# Patient Record
Sex: Female | Born: 1980 | Race: White | Hispanic: No | Marital: Single | Smoking: Current every day smoker
Health system: Southern US, Community
[De-identification: ages and names within clinical notes are randomized; demographics above are authoritative.]

---

## 2019-09-06 DIAGNOSIS — R0989 Other specified symptoms and signs involving the circulatory and respiratory systems: Secondary | ICD-10-CM | POA: Diagnosis not present

## 2019-09-06 DIAGNOSIS — U071 COVID-19: Secondary | ICD-10-CM | POA: Diagnosis not present

## 2019-09-06 DIAGNOSIS — R05 Cough: Secondary | ICD-10-CM | POA: Diagnosis not present

## 2019-09-06 DIAGNOSIS — R509 Fever, unspecified: Secondary | ICD-10-CM | POA: Diagnosis not present

## 2020-05-03 ENCOUNTER — Emergency Department
Admission: RE | Admit: 2020-05-03 | Discharge: 2020-05-03 | Disposition: A | Discharge status: Home / Self Care | Payer: BC Managed Care – PPO | Source: Ambulatory Visit | Attending: Family Medicine | Admitting: Family Medicine

## 2020-05-03 ENCOUNTER — Other Ambulatory Visit: Payer: Self-pay

## 2020-05-03 VITALS — BP 140/84 | HR 110 | Temp 98.8°F | Resp 18 | Ht 65.0 in | Wt 230.0 lb

## 2020-05-03 DIAGNOSIS — R059 Cough, unspecified: Secondary | ICD-10-CM

## 2020-05-03 DIAGNOSIS — J069 Acute upper respiratory infection, unspecified: Secondary | ICD-10-CM

## 2020-05-03 DIAGNOSIS — R05 Cough: Secondary | ICD-10-CM | POA: Diagnosis not present

## 2020-05-03 DIAGNOSIS — R21 Rash and other nonspecific skin eruption: Secondary | ICD-10-CM

## 2020-05-03 LAB — POC SARS CORONAVIRUS 2 AG -  ED: SARS Coronavirus 2 Ag: NEGATIVE

## 2020-05-03 NOTE — ED Triage Notes (Signed)
Pt c/o rash behind ears on Friday. By Midday Saturday, had rash all over back of neck. Now having runny nose, sneezing, congestion, sore throat, cough and clogged ears. Benedryl and ibuprofen prn.   No known covid exposure. Had covid in Jan. Has not had covid vaccinations.

## 2020-05-03 NOTE — Discharge Instructions (Addendum)
Take plain guaifenesin (1200mg  extended release tabs such as Mucinex) twice daily, with plenty of water, for cough and congestion.  May add Pseudoephedrine (30mg , one or two every 4 to 6 hours) for sinus congestion.  Get adequate rest.   May use Afrin nasal spray (or generic oxymetazoline) each morning for about 5 days and then discontinue.  Also recommend using saline nasal spray several times daily and saline nasal irrigation (AYR is a common brand).  Use Flonase nasal spray each morning after using Afrin nasal spray and saline nasal irrigation. Try warm salt water gargles for sore throat.  Stop all antihistamines for now, and other non-prescription cough/cold preparations. May take Ibuprofen 200mg , 4 tabs every 8 hours with food for body aches, fever, etc. May take Delsym Cough Suppressant ("12 Hour Cough Relief") at bedtime for nighttime cough.   Isolate yourself until COVID-19 test result is available.   If your COVID19 test is positive, then you are infected with the novel coronavirus and could give the virus to others.  Please continue isolation at home for at least 10 days since the start of your symptoms. Once you complete your 10 day quarantine, you may return to normal activities as long as you've not had a fever for over 24 hours (without taking fever reducing medicine) and your symptoms are improving. Please continue good preventive care measures, including:  frequent hand-washing, avoid touching your face, cover coughs/sneezes, stay out of crowds and keep a 6 foot distance from others.  Go to the nearest hospital emergency room if fever/cough/breathlessness are severe or illness seems like a threat to life.

## 2020-05-03 NOTE — ED Provider Notes (Signed)
[facility]    CSN: [id number] Arrival date & time: 05/03/20  1447      History   Chief Complaint Chief Complaint  Patient presents with  . Appointment    3pm  . Rash  . Cough  . Nasal Congestion    HPI Rebekah Gaines is a 39 y.o. female.   Patient developed a rash on her right neck behind her ear five days ago.  The next day she developed sinus congestion, myalgias, headache, and fatigue.  The rash spread somewhat to cover her posterior neck.  Yesterday she developed a cough, and today a fever.  She notes that her taste has been decreased today. She had COVID19 infection in January.  The history is provided by the patient.    History reviewed. No pertinent past medical history.  There are no problems to display for this patient.   History reviewed. No pertinent surgical history.  OB History   No obstetric history on file.      Home Medications    Prior to Admission medications   Not on File    Family History History reviewed. No pertinent family history.  Social History Social History   Tobacco Use  . Smoking status: Current Every Day Smoker    Packs/day: 1.00  . Smokeless tobacco: Never Used  Vaping Use  . Vaping Use: Never used  Substance Use Topics  . Alcohol use: Not Currently  . Drug use: Not on file     Allergies   Codeine and Hydrocodone   Review of Systems Review of Systems + sore throat + cough + sneezing No pleuritic pain No wheezing + nasal congestion + post-nasal drainage No sinus pain/pressure No itchy/red eyes ? earache No hemoptysis No SOB + fever, + chills No nausea No vomiting No abdominal pain No diarrhea No urinary symptoms + skin rash + fatigue + myalgias + headache Used OTC meds (Benadryl) without relief   Physical Exam Triage Vital Signs ED Triage Vitals  Enc Vitals Group     BP 05/03/20 1505 140/84     Pulse Rate 05/03/20 1505 (!) 110     Resp 05/03/20 1505 18     Temp  05/03/20 1505 98.8 F (37.1 C)     Temp Source 05/03/20 1505 Oral     SpO2 05/03/20 1505 98 %     Weight 05/03/20 1507 230 lb (104.3 kg)     Height 05/03/20 1507 5\' 5"  (1.651 m)     Head Circumference --      Peak Flow --      Pain Score 05/03/20 1506 2     Pain Loc --      Pain Edu? --      Excl. in GC? --    No data found.  Updated Vital Signs BP 140/84 (BP Location: Right Arm)   Pulse (!) 110   Temp 98.8 F (37.1 C) (Oral)   Resp 18   Ht 5\' 5"  (1.651 m)   Wt 104.3 kg   LMP 04/26/2020 (Approximate)   SpO2 98%   BMI 38.27 kg/m   Visual Acuity Right Eye Distance:   Left Eye Distance:   Bilateral Distance:    Right Eye Near:   Left Eye Near:    Bilateral Near:     Physical Exam Skin:    Findings: Rash present. Rash is macular. Rash is not crusting, nodular, pustular, urticarial or vesicular.          Comments:  Posterior neck has a mild macular irregular erythematous rash.  No warmth, swelling, or tenderness to palpation.    Nursing notes and Vital Signs reviewed. Appearance:  Patient appears stated age, and in no acute distress Eyes:  Pupils are equal, round, and reactive to light and accomodation.  Extraocular movement is intact.  Conjunctivae are not inflamed  Ears:  Canals normal.  Tympanic membranes normal.  Nose:  Mildly congested turbinates.  No sinus tenderness. Pharynx:  Normal Neck:  Supple.  Mildly enlarged lateral nodes are present, tender to palpation on the left.   Lungs:  Clear to auscultation.  Breath sounds are equal.  Moving air well. Heart:  Regular rate and rhythm without murmurs, rubs, or gallops.  Abdomen:  Nontender without masses or hepatosplenomegaly.  Bowel sounds are present.  No CVA or flank tenderness.  Extremities:  No edema.    UC Treatments / Results  Labs (all labs ordered are listed, but only abnormal results are displayed) Labs Reviewed  NOVEL CORONAVIRUS, NAA  POC SARS CORONAVIRUS 2 AG -  ED negative     EKG   Radiology No results found.  Procedures Procedures (including critical care time)  Medications Ordered in UC Medications - No data to display  Initial Impression / Assessment and Plan / UC Course  I have reviewed the triage vital signs and the nursing notes.  Pertinent labs & imaging results that were available during my care of the patient were reviewed by me and considered in my medical decision making (see chart for details).    There is no evidence of bacterial infection today.  Suspect recurrent COVID19, and viral exanthem. COVID19 PCR pending.   Final Clinical Impressions(s) / UC Diagnoses   Final diagnoses:  Cough  Viral URI with cough  Rash and nonspecific skin eruption     Discharge Instructions     Take plain guaifenesin (1200mg  extended release tabs such as Mucinex) twice daily, with plenty of water, for cough and congestion.  May add Pseudoephedrine (30mg , one or two every 4 to 6 hours) for sinus congestion.  Get adequate rest.   May use Afrin nasal spray (or generic oxymetazoline) each morning for about 5 days and then discontinue.  Also recommend using saline nasal spray several times daily and saline nasal irrigation (AYR is a common brand).  Use Flonase nasal spray each morning after using Afrin nasal spray and saline nasal irrigation. Try warm salt water gargles for sore throat.  Stop all antihistamines for now, and other non-prescription cough/cold preparations. May take Ibuprofen 200mg , 4 tabs every 8 hours with food for body aches, fever, etc. May take Delsym Cough Suppressant ("12 Hour Cough Relief") at bedtime for nighttime cough.   Isolate yourself until COVID-19 test result is available.   If your COVID19 test is positive, then you are infected with the novel coronavirus and could give the virus to others.  Please continue isolation at home for at least 10 days since the start of your symptoms. Once you complete your 10 day quarantine, you  may return to normal activities as long as you've not had a fever for over 24 hours (without taking fever reducing medicine) and your symptoms are improving. Please continue good preventive care measures, including:  frequent hand-washing, avoid touching your face, cover coughs/sneezes, stay out of crowds and keep a 6 foot distance from others.  Go to the nearest hospital emergency room if fever/cough/breathlessness are severe or illness seems like a threat to life.  ED Prescriptions    None        Lattie Haw, MD 05/06/20 1723

## 2020-05-05 LAB — NOVEL CORONAVIRUS, NAA: SARS-CoV-2, NAA: NOT DETECTED

## 2020-05-05 LAB — SARS-COV-2, NAA 2 DAY TAT

## 2020-05-08 ENCOUNTER — Other Ambulatory Visit: Payer: Self-pay

## 2020-05-08 ENCOUNTER — Emergency Department (INDEPENDENT_AMBULATORY_CARE_PROVIDER_SITE_OTHER): Payer: BC Managed Care – PPO

## 2020-05-08 ENCOUNTER — Emergency Department
Admission: RE | Admit: 2020-05-08 | Discharge: 2020-05-08 | Disposition: A | Discharge status: Home / Self Care | Payer: BC Managed Care – PPO | Source: Ambulatory Visit

## 2020-05-08 VITALS — BP 141/89 | HR 86 | Temp 98.7°F | Resp 16

## 2020-05-08 DIAGNOSIS — R5383 Other fatigue: Secondary | ICD-10-CM

## 2020-05-08 DIAGNOSIS — J9801 Acute bronchospasm: Secondary | ICD-10-CM | POA: Diagnosis not present

## 2020-05-08 DIAGNOSIS — R21 Rash and other nonspecific skin eruption: Secondary | ICD-10-CM | POA: Diagnosis not present

## 2020-05-08 DIAGNOSIS — R05 Cough: Secondary | ICD-10-CM | POA: Diagnosis not present

## 2020-05-08 DIAGNOSIS — F17200 Nicotine dependence, unspecified, uncomplicated: Secondary | ICD-10-CM | POA: Diagnosis not present

## 2020-05-08 DIAGNOSIS — F17210 Nicotine dependence, cigarettes, uncomplicated: Secondary | ICD-10-CM | POA: Diagnosis not present

## 2020-05-08 DIAGNOSIS — J069 Acute upper respiratory infection, unspecified: Secondary | ICD-10-CM

## 2020-05-08 MED ORDER — PREDNISONE 50 MG PO TABS: 50.0000 mg | ORAL_TABLET | Freq: Every day | ORAL | 0 refills | Status: AC

## 2020-05-08 MED ORDER — DEXAMETHASONE SODIUM PHOSPHATE 10 MG/ML IJ SOLN
10.0000 mg | Freq: Once | INTRAMUSCULAR | Status: AC
  Administered 2020-05-08: 10 mg via INTRAMUSCULAR

## 2020-05-08 MED ORDER — ALBUTEROL SULFATE HFA 108 (90 BASE) MCG/ACT IN AERS: 1.0000 | INHALATION_SPRAY | Freq: Four times a day (QID) | RESPIRATORY_TRACT | 0 refills | Status: DC | PRN

## 2020-05-08 MED ORDER — PROMETHAZINE-DM 6.25-15 MG/5ML PO SYRP: 5.0000 mL | ORAL_SOLUTION | Freq: Three times a day (TID) | ORAL | 0 refills | Status: DC | PRN

## 2020-05-08 NOTE — ED Triage Notes (Signed)
Patient presents to Urgent Care with complaints of dry cough since last week. Patient reports she was here last week for same, but the other symptoms have gone away and the cough is relentless.  Pt was tested for covid during her visit last week, rapid and PCR were both negative.

## 2020-05-08 NOTE — Discharge Instructions (Signed)
°  Please take the medications as prescribed. You were given your first dose of steroids tonight. You may start the oral prednisone pills tomorrow or Wednesday morning with breakfast to help with cough.  Promethazine-dextromethorphan (promethazine-DM) is a strong cough medication to help you limit your coughing at night to help you rest. It can cause drowsiness and dizziness. It can cause trouble breathing and accidental overdose if taking more than prescribed or if taken with other medications that can cause drowsiness. Do not take with Benadryl, Nyquil, Tylenol PM or other medications that can cause drowsiness. Do not drive or operate heavy machinery while taking.    Call to schedule a follow up appointment with your primary care provider or establish care with a new primary care provider if needed. You may also try to schedule an appointment with the post-COVID care center for further evaluation and treatment of symptoms that may be related to your initial COVID infection in January.   Call 911 or have someone drive you to the hospital if symptoms significantly worsening.

## 2020-05-08 NOTE — ED Provider Notes (Signed)
[facility]    CSN: [id number] Arrival date & time: 05/08/20  1753      History   Chief Complaint Chief Complaint  Patient presents with  . Appointment    6:00  . Cough    HPI Rebekah Gaines is a 39 y.o. female.   HPI  Rebekah Gaines is a 39 y.o. female presenting to UC with c/o continued dry hacking cough that keeps her up at night. Pt was seen at [facility] on 05/03/20, dx with a viral URI. She had a negative rapid COVID and negative PCR COVID test. Her congestion, headaches, myalgias, fatigue and low grade fever have resolved but she has a hoarse voice and continued cough.  She did have COVID in January 2021.  States her chest heaviness never fully resolved.  No known sick contacts recently.  Denies n/v/d. She has tried various cough medications without relief.  No hx of asthma but needed an inhaler after getting sick about 10 years ago.   History reviewed. No pertinent past medical history.  There are no problems to display for this patient.   History reviewed. No pertinent surgical history.  OB History   No obstetric history on file.      Home Medications    Prior to Admission medications   Medication Sig Start Date End Date Taking? Authorizing Provider  albuterol (VENTOLIN HFA) 108 (90 Base) MCG/ACT inhaler Inhale 1-2 puffs into the lungs every 6 (six) hours as needed for wheezing or shortness of breath. 05/08/20   Lurene Shadow, PA-C  predniSONE (DELTASONE) 50 MG tablet Take 1 tablet (50 mg total) by mouth daily with breakfast for 5 days. 05/08/20 05/13/20  Lurene Shadow, PA-C  promethazine-dextromethorphan (PROMETHAZINE-DM) 6.25-15 MG/5ML syrup Take 5 mLs by mouth 3 (three) times daily as needed for cough. 05/08/20   Lurene Shadow, PA-C    Family History Family History  Problem Relation Age of Onset  . Healthy Mother   . Healthy Father     Social History Social History   Tobacco Use  . Smoking status: Current Every Day Smoker     Packs/day: 1.00  . Smokeless tobacco: Never Used  Vaping Use  . Vaping Use: Never used  Substance Use Topics  . Alcohol use: Not Currently  . Drug use: Not on file     Allergies   Codeine and Hydrocodone   Review of Systems Review of Systems  Constitutional: Negative for chills and fever.  HENT: Positive for voice change. Negative for congestion, ear pain, sore throat and trouble swallowing.   Respiratory: Positive for cough and chest tightness. Negative for shortness of breath.   Cardiovascular: Negative for chest pain and palpitations.  Gastrointestinal: Negative for abdominal pain, diarrhea, nausea and vomiting.  Musculoskeletal: Negative for arthralgias, back pain and myalgias.  Skin: Negative for rash.  Neurological: Negative for dizziness, light-headedness and headaches.  All other systems reviewed and are negative.    Physical Exam Triage Vital Signs ED Triage Vitals  Enc Vitals Group     BP 05/08/20 1850 (!) 141/89     Pulse Rate 05/08/20 1850 86     Resp 05/08/20 1850 16     Temp 05/08/20 1850 98.7 F (37.1 C)     Temp Source 05/08/20 1850 Oral     SpO2 05/08/20 1850 99 %     Weight --      Height --      Head Circumference --  Peak Flow --      Pain Score 05/08/20 1848 1     Pain Loc --      Pain Edu? --      Excl. in GC? --    No data found.  Updated Vital Signs BP (!) 141/89 (BP Location: Right Arm)   Pulse 86   Temp 98.7 F (37.1 C) (Oral)   Resp 16   LMP 04/26/2020 (Approximate)   SpO2 99%   Visual Acuity Right Eye Distance:   Left Eye Distance:   Bilateral Distance:    Right Eye Near:   Left Eye Near:    Bilateral Near:     Physical Exam Vitals and nursing note reviewed.  Constitutional:      General: She is not in acute distress.    Appearance: Normal appearance. She is well-developed. She is not ill-appearing, toxic-appearing or diaphoretic.  HENT:     Head: Normocephalic and atraumatic.     Right Ear: Tympanic membrane  and ear canal normal.     Left Ear: Tympanic membrane and ear canal normal.     Nose: Nose normal.     Mouth/Throat:     Mouth: Mucous membranes are moist.     Pharynx: Oropharynx is clear.  Cardiovascular:     Rate and Rhythm: Normal rate and regular rhythm.  Pulmonary:     Effort: Pulmonary effort is normal. No respiratory distress.     Breath sounds: No stridor. Wheezing present. No rhonchi or rales.     Comments: Dry hacking cough during exam but no respiratory distress. Able to speak in full sentences. No accessory muscle use.  Hoarse voice but no stridor. Diffuse wheeze noted.  Musculoskeletal:        General: Normal range of motion.     Cervical back: Normal range of motion and neck supple. No tenderness.  Lymphadenopathy:     Cervical: No cervical adenopathy.  Skin:    General: Skin is warm and dry.  Neurological:     Mental Status: She is alert and oriented to person, place, and time.  Psychiatric:        Behavior: Behavior normal.      UC Treatments / Results  Labs (all labs ordered are listed, but only abnormal results are displayed) Labs Reviewed - No data to display  EKG   Radiology DG Chest 2 View  Result Date: 05/08/2020 CLINICAL DATA:  39 year old female with cough, fatigue, rash. Negative for COVID-19 last week. Smoker. EXAM: CHEST - 2 VIEW COMPARISON:  None. FINDINGS: Lung volumes and mediastinal contours are within normal limits. Visualized tracheal air column is within normal limits. No pneumothorax, pleural effusion or confluent pulmonary opacity. But there is mild diffuse bilateral increased pulmonary interstitial opacity, symmetric. No acute osseous abnormality identified. Negative visible bowel gas pattern. Cholecystectomy clips. IMPRESSION: Mild diffuse increased pulmonary interstitial opacity is favored to be the sequelae of smoking, but viral/atypical respiratory infection is difficult to exclude. Electronically Signed   By: Odessa Fleming M.D.   On:  05/08/2020 19:18    Procedures Procedures (including critical care time)  Medications Ordered in UC Medications  dexamethasone (DECADRON) injection 10 mg (has no administration in time range)    Initial Impression / Assessment and Plan / UC Course  I have reviewed the triage vital signs and the nursing notes.  Pertinent labs & imaging results that were available during my care of the patient were reviewed by me and considered in my medical decision making (  see chart for details).     Discussed imaging with pt Encouraged to stop smoking cigarettes Due to prior COVID infection, encouraged to call post-COVID care clinic for appointment as they may be able to help with ongoing care.  Discussed symptoms that warrant emergent care in the ED. AVS given  Final Clinical Impressions(s) / UC Diagnoses   Final diagnoses:  Acute bronchospasm  Viral URI with cough  Cigarette smoker     Discharge Instructions      Please take the medications as prescribed. You were given your first dose of steroids tonight. You may start the oral prednisone pills tomorrow or Wednesday morning with breakfast to help with cough.  Promethazine-dextromethorphan (promethazine-DM) is a strong cough medication to help you limit your coughing at night to help you rest. It can cause drowsiness and dizziness. It can cause trouble breathing and accidental overdose if taking more than prescribed or if taken with other medications that can cause drowsiness. Do not take with Benadryl, Nyquil, Tylenol PM or other medications that can cause drowsiness. Do not drive or operate heavy machinery while taking.    Call to schedule a follow up appointment with your primary care provider or establish care with a new primary care provider if needed. You may also try to schedule an appointment with the post-COVID care center for further evaluation and treatment of symptoms that may be related to your initial COVID infection in  January.   Call 911 or have someone drive you to the hospital if symptoms significantly worsening.     ED Prescriptions    Medication Sig Dispense Auth. Provider   albuterol (VENTOLIN HFA) 108 (90 Base) MCG/ACT inhaler Inhale 1-2 puffs into the lungs every 6 (six) hours as needed for wheezing or shortness of breath. 18 g Duell Holdren O, PA-C   promethazine-dextromethorphan (PROMETHAZINE-DM) 6.25-15 MG/5ML syrup Take 5 mLs by mouth 3 (three) times daily as needed for cough. 118 mL Doroteo Glassman, Kamdyn Colborn O, PA-C   predniSONE (DELTASONE) 50 MG tablet Take 1 tablet (50 mg total) by mouth daily with breakfast for 5 days. 5 tablet Lurene Shadow, PA-C     PDMP not reviewed this encounter.   Lurene Shadow, [place] 05/08/20 1928

## 2021-04-15 ENCOUNTER — Encounter: Payer: Self-pay | Admitting: Emergency Medicine

## 2021-04-15 ENCOUNTER — Emergency Department
Admission: EM | Admit: 2021-04-15 | Discharge: 2021-04-15 | Disposition: A | Discharge status: Home / Self Care | Payer: BC Managed Care – PPO | Source: Home / Self Care | Attending: Family Medicine | Admitting: Family Medicine

## 2021-04-15 ENCOUNTER — Other Ambulatory Visit: Payer: Self-pay

## 2021-04-15 DIAGNOSIS — M7662 Achilles tendinitis, left leg: Secondary | ICD-10-CM

## 2021-04-15 MED ORDER — IBUPROFEN 600 MG PO TABS
600.0000 mg | ORAL_TABLET | Freq: Once | ORAL | Status: AC
  Administered 2021-04-15: 600 mg via ORAL

## 2021-04-15 MED ORDER — TRAMADOL HCL 50 MG PO TABS: 50.0000 mg | ORAL_TABLET | Freq: Four times a day (QID) | ORAL | 0 refills | Status: DC | PRN

## 2021-04-15 MED ORDER — KETOROLAC TROMETHAMINE 60 MG/2ML IM SOLN: 60.0000 mg | Freq: Once | INTRAMUSCULAR | Status: DC

## 2021-04-15 MED ORDER — METHYLPREDNISOLONE 4 MG PO TBPK: ORAL_TABLET | ORAL | 0 refills | Status: DC

## 2021-04-15 NOTE — ED Triage Notes (Signed)
Patient states that she went to be on Friday night w/o any problem, awoke yesterday in extreme pain in her left foot.  No injury.  Unable to bare weight.  Pain radiates up the back of the foot.  Patient has taken Ibuprofen, elevation and ICE.

## 2021-04-15 NOTE — ED Provider Notes (Signed)
[facility]    CSN: [id number] Arrival date & time: 04/15/21  1432      History   Chief Complaint Chief Complaint  Patient presents with   Foot Pain    HPI Rebekah Gaines is a 40 y.o. female.   HPI  She states she was working a normal day on Friday.  Completed 9-hour shift.  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  She did not have any unusual activity.  No fall.  No new shoewear.  Woke up Saturday morning with severe ankle pain.  Now can hardly bear weight because of the pain.  Is never had problems like this before.  History reviewed. No pertinent past medical history.  There are no problems to display for this patient.   History reviewed. No pertinent surgical history.  OB History   No obstetric history on file.      Home Medications    Prior to Admission medications   Medication Sig Start Date End Date Taking? Authorizing Provider  albuterol (VENTOLIN HFA) 108 (90 Base) MCG/ACT inhaler Inhale 1-2 puffs into the lungs every 6 (six) hours as needed for wheezing or shortness of breath. 05/08/20  Yes Lurene Shadow, PA-C  methylPREDNISolone (MEDROL DOSEPAK) 4 MG TBPK tablet tad 04/15/21  Yes Eustace Moore, MD  traMADol (ULTRAM) 50 MG tablet Take 1 tablet (50 mg total) by mouth every 6 (six) hours as needed. 04/15/21  Yes Eustace Moore, MD  promethazine-dextromethorphan (PROMETHAZINE-DM) 6.25-15 MG/5ML syrup Take 5 mLs by mouth 3 (three) times daily as needed for cough. 05/08/20   Lurene Shadow, PA-C    Family History Family History  Problem Relation Age of Onset   Healthy Mother    Healthy Father     Social History Social History   Tobacco Use   Smoking status: Every Day    Packs/day: 1.00    Types: Cigarettes   Smokeless tobacco: Never  Vaping Use   Vaping Use: Never used  Substance Use Topics   Alcohol use: Not Currently     Allergies   Codeine and Hydrocodone   Review of Systems Review of  Systems See HPI  Physical Exam Triage Vital Signs ED Triage Vitals  Enc Vitals Group     BP 04/15/21 1457 133/85     Pulse Rate 04/15/21 1457 81     Resp 04/15/21 1457 18     Temp 04/15/21 1457 98.5 F (36.9 C)     Temp Source 04/15/21 1457 Oral     SpO2 04/15/21 1457 98 %     Weight 04/15/21 1459 230 lb (104.3 kg)     Height 04/15/21 1459 5' 6.25" (1.683 m)     Head Circumference --      Peak Flow --      Pain Score 04/15/21 1458 5     Pain Loc --      Pain Edu? --      Excl. in GC? --    No data found.  Updated Vital Signs BP 133/85 (BP Location: Right Arm)   Pulse 81   Temp 98.5 F (36.9 C) (Oral)   Resp 18   Ht 5' 6.25" (1.683 m)   Wt 104.3 kg   LMP 04/09/2021   SpO2 98%   BMI 36.84 kg/m      Physical Exam Vitals and nursing note reviewed.  Constitutional:      General: She is not in acute  distress.    Appearance: She is well-developed. She is obese.  HENT:     Head: Normocephalic and atraumatic.     Mouth/Throat:     Comments: Mask is in place Eyes:     Conjunctiva/sclera: Conjunctivae normal.     Pupils: Pupils are equal, round, and reactive to light.  Cardiovascular:     Rate and Rhythm: Normal rate.  Pulmonary:     Effort: Pulmonary effort is normal. No respiratory distress.  Abdominal:     General: There is no distension.     Palpations: Abdomen is soft.  Musculoskeletal:        General: Normal range of motion.     Cervical back: Normal range of motion.     Comments: Patient holds left foot in slightly flexed position.  Has tenderness of her Achilles tendons above Achilles bursa region.  Warmth in the area.  No swelling noted.  The Achilles tendon is palpated lightly and no defect is felt.  Skin:    General: Skin is warm and dry.  Neurological:     Mental Status: She is alert.     Gait: Gait abnormal.  Psychiatric:        Mood and Affect: Mood normal.        Behavior: Behavior normal.     UC Treatments / Results  Labs (all labs  ordered are listed, but only abnormal results are displayed) Labs Reviewed - No data to display  EKG   Radiology No results found.  Procedures Procedures (including critical care time)  Medications Ordered in UC Medications  ibuprofen (ADVIL) tablet 600 mg (600 mg Oral Given 04/15/21 1555)    Initial Impression / Assessment and Plan / UC Course  I have reviewed the triage vital signs and the nursing notes.  Pertinent labs & imaging results that were available during my care of the patient were reviewed by me and considered in my medical decision making (see chart for details).     Discussed ice.  Rest.  Anti-inflammatories.  Medicine follow-up. Final Clinical Impressions(s) / UC Diagnoses   Final diagnoses:  Left Achilles tendinitis     Discharge Instructions      Stay off of foot.  Wear boot Use ice on painful heel area for 20 minutes every couple of hours Take the prednisone pack as directed.  Take all of day 1 today Take tramadol if needed for severe pain.  Take tramadol with food Follow-up with sports medicine.  Call in the morning for an appointment next week   ED Prescriptions     Medication Sig Dispense Auth. Provider   methylPREDNISolone (MEDROL DOSEPAK) 4 MG TBPK tablet tad 21 tablet Eustace Moore, MD   traMADol (ULTRAM) 50 MG tablet Take 1 tablet (50 mg total) by mouth every 6 (six) hours as needed. 15 tablet Eustace Moore, MD      I have reviewed the PDMP during this encounter.   Eustace Moore, MD 04/15/21 [phone]

## 2021-04-15 NOTE — Discharge Instructions (Signed)
Stay off of foot.  Wear boot Use ice on painful heel area for 20 minutes every couple of hours Take the prednisone pack as directed.  Take all of day 1 today Take tramadol if needed for severe pain.  Take tramadol with food Follow-up with sports medicine.  Call in the morning for an appointment next week

## 2021-04-25 ENCOUNTER — Ambulatory Visit (INDEPENDENT_AMBULATORY_CARE_PROVIDER_SITE_OTHER): Payer: BC Managed Care – PPO | Admitting: Sports Medicine

## 2021-04-25 ENCOUNTER — Other Ambulatory Visit: Payer: Self-pay

## 2021-04-25 DIAGNOSIS — M5416 Radiculopathy, lumbar region: Secondary | ICD-10-CM | POA: Insufficient documentation

## 2021-04-25 DIAGNOSIS — S86112A Strain of other muscle(s) and tendon(s) of posterior muscle group at lower leg level, left leg, initial encounter: Secondary | ICD-10-CM | POA: Diagnosis not present

## 2021-04-25 MED ORDER — IBUPROFEN 200 MG PO TABS: 800.0000 mg | ORAL_TABLET | Freq: Three times a day (TID) | ORAL | 0 refills | Status: AC | PRN

## 2021-04-25 NOTE — Progress Notes (Signed)
    Procedures performed today:    None.  Independent interpretation of notes and tests performed by another provider:   None.  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 Approximately week and a half ago she got a bed, took a step and felt severe pain in her left calf, she localizes this at the musculotendinous junction of her gastrocnemius and the Achilles. She was seen in urgent care, placed in a boot and referred to me, on exam she has a bit of bruising over the proximal Achilles, tenderness at the musculotendinous junction, she has a negative Thompson's test. Continue boot, I will add heel lifts to her boot and her shoe, she will do 800 mg of ibuprofen 3 times daily, she prefers over-the-counter. Continue boot for only another week. Calf strain conditioning given. Return to see me in approximately 2 weeks.    ___________________________________________ Ihor Austin. Benjamin Stain, M.D., ABFM., CAQSM. Primary Care and Sports Medicine [facility]  Adjunct Instructor of Family Medicine  [employer]

## 2021-04-25 NOTE — Assessment & Plan Note (Signed)
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 Approximately week and a half ago she got a bed, took a step and felt severe pain in her left calf, she localizes this at the musculotendinous junction of her gastrocnemius and the Achilles. She was seen in urgent care, placed in a boot and referred to me, on exam she has a bit of bruising over the proximal Achilles, tenderness at the musculotendinous junction, she has a negative Thompson's test. Continue boot, I will add heel lifts to her boot and her shoe, she will do 800 mg of ibuprofen 3 times daily, she prefers over-the-counter. Continue boot for only another week. Calf strain conditioning given. Return to see me in approximately 2 weeks.

## 2021-05-02 ENCOUNTER — Other Ambulatory Visit: Payer: Self-pay

## 2021-05-02 ENCOUNTER — Emergency Department
Admission: EM | Admit: 2021-05-02 | Discharge: 2021-05-02 | Disposition: A | Discharge status: Home / Self Care | Payer: BC Managed Care – PPO | Source: Home / Self Care | Attending: Family Medicine | Admitting: Family Medicine

## 2021-05-02 ENCOUNTER — Encounter: Payer: Self-pay | Admitting: Emergency Medicine

## 2021-05-02 DIAGNOSIS — R200 Anesthesia of skin: Secondary | ICD-10-CM | POA: Diagnosis not present

## 2021-05-02 MED ORDER — GABAPENTIN 300 MG PO CAPS: ORAL_CAPSULE | ORAL | 0 refills | Status: AC

## 2021-05-02 NOTE — Discharge Instructions (Addendum)
Gabapentin is for nerve pain.  Start with one at bedtime.  Then increase to two a day, then three a day as you build up tolerance to drowsiness.   Continue advil 600 mg 3x a day with food Activity as tolerated See Dr Benjamin Stain at his next available opening

## 2021-05-02 NOTE — ED Triage Notes (Signed)
Rt Leg pain, tingling, numbness x 5 days. Seeing Dr T for treatment called him today he advised her to wait until her appointment on the 14th, to see him, but she did not want to wait. Was in aircast until Monday, had a heel insert for 2 days took heel insert out because tingling started.

## 2021-05-02 NOTE — ED Provider Notes (Signed)
[facility]    CSN: [id number] Arrival date & time: 05/02/21  1428      History   Chief Complaint No chief complaint on file.   HPI Rebekah Gaines is a 40 y.o. female.   HPI  Patient is here for follow-up of her leg injury.  She states that she followed my instructions and followed up with Dr. Benjamin Stain.  He placed her inhalers.  He gave her exercises.  He recommended continued use of Advil.  Activity as tolerated.  She states that he put heel lifts in her shoe.  I explained to her that this is appropriate treatment for Achilles tendinitis.  She states that they bothered her so she removes them Friday after work, 5 days ago.  Since that time she has developed pain and numbness in her right leg.  This is the opposite leg from her original Achilles tendinitis.  She states that if she puts her foot on the floor she cannot tell whether the floor is hot or cold.  The entire foot is numb.  The numbness extends up the right leg and she points to her anterior superior iliac spine as the origin.  No back pain.  No back injury.  She feels like this is happened because of gait alteration due to heel lifts due to Achilles tendinitis.  She has never had back problems or sciatica.  She is never experienced numbness in extremity.  She is not diabetic.  Has never been diagnosed with vitamin deficiencies or other cause of a neuropathy.  Does not have any neuropathies that run in her family.  History reviewed. No pertinent past medical history.  Patient Active Problem List   Diagnosis Date Noted   Gastrocnemius strain, left 04/25/2021    History reviewed. No pertinent surgical history.  OB History   No obstetric history on file.      Home Medications    Prior to Admission medications   Medication Sig Start Date End Date Taking? Authorizing Provider  gabapentin (NEURONTIN) 300 MG capsule Start with one capsule po at night.  Increase to 2x then 3x a day as tolerated.  May  cause drowsiness 05/02/21  Yes Eustace Moore, MD  ibuprofen (ADVIL) 200 MG tablet Take 4 tablets (800 mg total) by mouth every 8 (eight) hours as needed. 04/25/21   Monica Becton, MD    Family History Family History  Problem Relation Age of Onset   Healthy Mother    Healthy Father     Social History Social History   Tobacco Use   Smoking status: Every Day    Packs/day: 1.00    Types: Cigarettes   Smokeless tobacco: Never  Vaping Use   Vaping Use: Never used  Substance Use Topics   Alcohol use: Yes   Drug use: Not Currently     Allergies   Codeine and Hydrocodone   Review of Systems Review of Systems See H PI  Physical Exam Triage Vital Signs ED Triage Vitals  Enc Vitals Group     BP 05/02/21 1443 132/86     Pulse Rate 05/02/21 1443 93     Resp 05/02/21 1443 18     Temp 05/02/21 1443 99 F (37.2 C)     Temp Source 05/02/21 1443 Oral     SpO2 05/02/21 1443 97 %     Weight 05/02/21 1444 240 lb (108.9 kg)     Height 05/02/21 1444 5\' 5"  (1.651 m)  Head Circumference --      Peak Flow --      Pain Score 05/02/21 1443 5     Pain Loc --      Pain Edu? --      Excl. in GC? --    No data found.  Updated Vital Signs BP 132/86 (BP Location: Left Arm)   Pulse 93   Temp 99 F (37.2 C) (Oral)   Resp 18   Ht 5\' 5"  (1.651 m)   Wt 108.9 kg   LMP 04/09/2021   SpO2 97%   BMI 39.94 kg/m      Physical Exam Constitutional:      General: She is not in acute distress.    Appearance: She is well-developed. She is obese.  HENT:     Head: Normocephalic and atraumatic.     Mouth/Throat:     Comments: Mask in place Eyes:     Conjunctiva/sclera: Conjunctivae normal.     Pupils: Pupils are equal, round, and reactive to light.  Cardiovascular:     Rate and Rhythm: Normal rate.  Pulmonary:     Effort: Pulmonary effort is normal. No respiratory distress.  Abdominal:     General: There is no distension.     Palpations: Abdomen is soft.   Musculoskeletal:        General: No swelling, tenderness, deformity or signs of injury. Normal range of motion.     Cervical back: Normal range of motion.  Skin:    General: Skin is warm and dry.  Neurological:     Mental Status: She is alert.     Sensory: Sensory deficit present.     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  Every time I touch even lightly she vocalizes and pulls away.  It does not follow a radicular pattern particularly, its includes the entire bottom of her foot, the posterior and both lateral portions of her lower leg with less discomfort in her anterior lower leg.  It is almost a stocking distribution up the leg.  She expresses pain with palpation of the anterior superior iliac spine and greater trochanter.  There is no particular tenderness with palpation over the lumbar spine or SI joint.  She states that straight leg raise on the right increases the tingling and numbness.  Motor examination of lower extremity normal.  Reflexes intact     UC Treatments / Results  Labs (all labs ordered are listed, but only abnormal results are displayed) Labs Reviewed - No data to display  EKG   Radiology No results found.  Procedures Procedures (including critical care time)  Medications Ordered in UC Medications - No data to display  Initial Impression / Assessment and Plan / UC Course  I have reviewed the triage vital signs and the nursing notes.  Pertinent labs & imaging results that were available during my care of the patient were reviewed by me and considered in my medical decision making (see chart for details).     Unusual distribution of numbness described with the patient.  Unusual response to sensory examination.  We will treat her with gabapentin, continue Advil, decreased activity until treatment can be seen again by Dr. 04/11/2021.  Concern for emotional conversion versus beginning neurologic disease such as  peripheral neuropathy or early ALS.  Nerve conduction studies may be indicated if the examination remains abnormal Final Clinical Impressions(s) / UC Diagnoses   Final diagnoses:  Right leg numbness  Discharge Instructions      Gabapentin is for nerve pain.  Start with one at bedtime.  Then increase to two a day, then three a day as you build up tolerance to drowsiness.   Continue advil 600 mg 3x a day with food Activity as tolerated See Dr Benjamin Stain at his next available opening     ED Prescriptions     Medication Sig Dispense Auth. Provider   gabapentin (NEURONTIN) 300 MG capsule Start with one capsule po at night.  Increase to 2x then 3x a day as tolerated.  May cause drowsiness 50 capsule Eustace Moore, MD      PDMP not reviewed this encounter.   Eustace Moore, MD 05/02/21 [phone]

## 2021-05-07 ENCOUNTER — Ambulatory Visit (INDEPENDENT_AMBULATORY_CARE_PROVIDER_SITE_OTHER): Payer: BC Managed Care – PPO | Admitting: Sports Medicine

## 2021-05-07 ENCOUNTER — Other Ambulatory Visit: Payer: Self-pay

## 2021-05-07 ENCOUNTER — Ambulatory Visit (INDEPENDENT_AMBULATORY_CARE_PROVIDER_SITE_OTHER): Payer: BC Managed Care – PPO

## 2021-05-07 DIAGNOSIS — M5416 Radiculopathy, lumbar region: Secondary | ICD-10-CM | POA: Diagnosis not present

## 2021-05-07 DIAGNOSIS — R200 Anesthesia of skin: Secondary | ICD-10-CM | POA: Diagnosis not present

## 2021-05-07 DIAGNOSIS — M21371 Foot drop, right foot: Secondary | ICD-10-CM

## 2021-05-07 DIAGNOSIS — M2578 Osteophyte, vertebrae: Secondary | ICD-10-CM | POA: Diagnosis not present

## 2021-05-07 DIAGNOSIS — R202 Paresthesia of skin: Secondary | ICD-10-CM

## 2021-05-07 DIAGNOSIS — M47816 Spondylosis without myelopathy or radiculopathy, lumbar region: Secondary | ICD-10-CM | POA: Diagnosis not present

## 2021-05-07 NOTE — Progress Notes (Addendum)
    Procedures performed today:    None.  Independent interpretation of notes and tests performed by another provider:   None.  Brief History, Exam, Impression, and Recommendations:    Lumbar radiculopathy, right Rebekah Gaines returns, she is pleasant 40 year old female, we treated her initially for what we suspected to be a gastrocnemius strain, her symptoms have now declare themselves is more radicular, numbness and tingling down the back of the right leg to the bottom of the right foot with loss of temperature sensation. She is also got some foot drop on the left. She had steroids in the recent past that improved her symptoms dramatically but unfortunately recurred. Proceed with x-rays, lumbar spine MRI early due to progressive weakness. Continue gabapentin prescribed in urgent care, out of work until MRI. I do suspect she will get an epidural. Return to see me to go over MRI results.  Update: DDD noted on MRI without overt right-sided foraminal stenosis, right L5-S1 interlaminar epidural ordered.    ___________________________________________ Rebekah Gaines. Rebekah Gaines, M.D., ABFM., CAQSM. Primary Care and Sports Medicine [facility]  Adjunct Instructor of Family Medicine  [employer]

## 2021-05-07 NOTE — Assessment & Plan Note (Addendum)
Rebekah Gaines returns, she is pleasant 40 year old female, we treated her initially for what we suspected to be a gastrocnemius strain, her symptoms have now declare themselves is more radicular, numbness and tingling down the back of the right leg to the bottom of the right foot with loss of temperature sensation. She is also got some foot drop on the left. She had steroids in the recent past that improved her symptoms dramatically but unfortunately recurred. Proceed with x-rays, lumbar spine MRI early due to progressive weakness. Continue gabapentin prescribed in urgent care, out of work until MRI. I do suspect she will get an epidural. Return to see me to go over MRI results.  Update: DDD noted on MRI without overt right-sided foraminal stenosis, right L5-S1 interlaminar epidural ordered.

## 2021-05-09 ENCOUNTER — Ambulatory Visit: Payer: BC Managed Care – PPO | Admitting: Sports Medicine

## 2021-05-13 ENCOUNTER — Ambulatory Visit (INDEPENDENT_AMBULATORY_CARE_PROVIDER_SITE_OTHER): Payer: BC Managed Care – PPO

## 2021-05-13 ENCOUNTER — Other Ambulatory Visit: Payer: Self-pay

## 2021-05-13 DIAGNOSIS — M5416 Radiculopathy, lumbar region: Secondary | ICD-10-CM

## 2021-05-14 NOTE — Addendum Note (Signed)
Addended by: Monica Becton on: 05/14/2021 05:02 PM   Modules accepted: Orders

## 2021-05-25 ENCOUNTER — Other Ambulatory Visit: Payer: Self-pay

## 2021-05-25 ENCOUNTER — Ambulatory Visit
Admission: RE | Admit: 2021-05-25 | Discharge: 2021-05-25 | Disposition: A | Discharge status: Home / Self Care | Payer: BC Managed Care – PPO | Source: Ambulatory Visit | Attending: Sports Medicine | Admitting: Sports Medicine

## 2021-05-25 DIAGNOSIS — M5116 Intervertebral disc disorders with radiculopathy, lumbar region: Secondary | ICD-10-CM | POA: Diagnosis not present

## 2021-05-25 DIAGNOSIS — M5416 Radiculopathy, lumbar region: Secondary | ICD-10-CM

## 2021-05-25 MED ORDER — IOPAMIDOL (ISOVUE-M 200) INJECTION 41%
1.0000 mL | Freq: Once | INTRAMUSCULAR | Status: AC
  Administered 2021-05-25: 1 mL via EPIDURAL

## 2021-05-25 MED ORDER — METHYLPREDNISOLONE ACETATE 40 MG/ML INJ SUSP (RADIOLOG
80.0000 mg | Freq: Once | INTRAMUSCULAR | Status: AC
  Administered 2021-05-25: 80 mg via EPIDURAL

## 2021-05-25 NOTE — Discharge Instructions (Signed)

## 2022-01-08 IMAGING — MR MR LUMBAR SPINE W/O CM
4 of 5 series · 26 of 48 positions shown · non-contrast
Comparison: No prior MRI, correlation is made with radiographs
05/07/2021

CLINICAL DATA: Low back pain, right-sided paresthesias, left-sided
foot drop

EXAM:
MRI LUMBAR SPINE WITHOUT CONTRAST
TECHNIQUE: Multiplanar, multisequence MR imaging of the lumbar spine was
performed. No intravenous contrast was administered.

[Series 2: T2 · sagittal · 4.0mm · 0.81mm/px · 6 of 15 slices shown (1 of 2)]
[im 1/15]
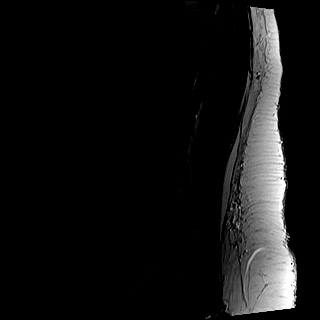
[im 3/15]
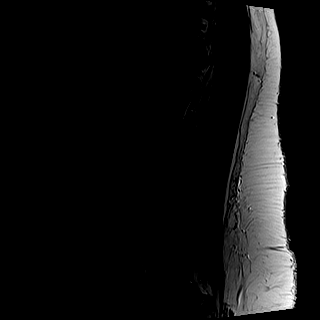
[im 6/15]
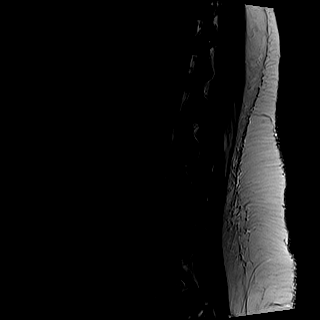
[im 9/15]
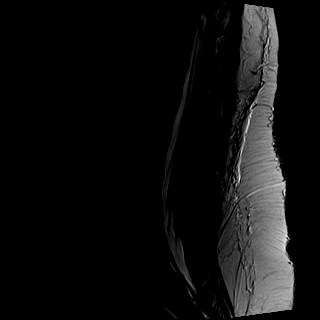
[im 12/15]
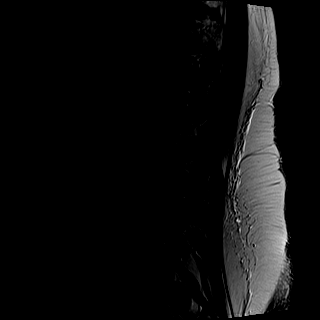
[im 15/15]
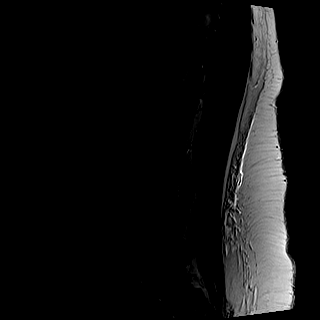

[Series 3: T1 · sagittal · 4.0mm · 0.41mm/px · 6 of 15 slices shown (1 of 2)]
[im 1/15]
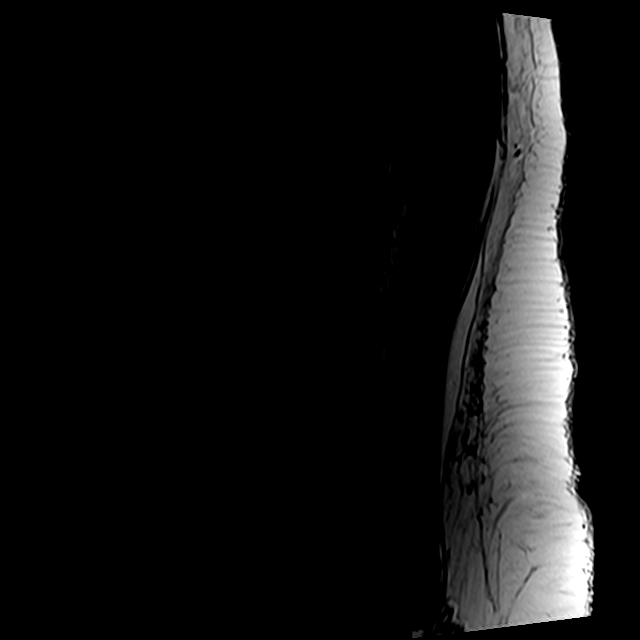
[im 3/15]
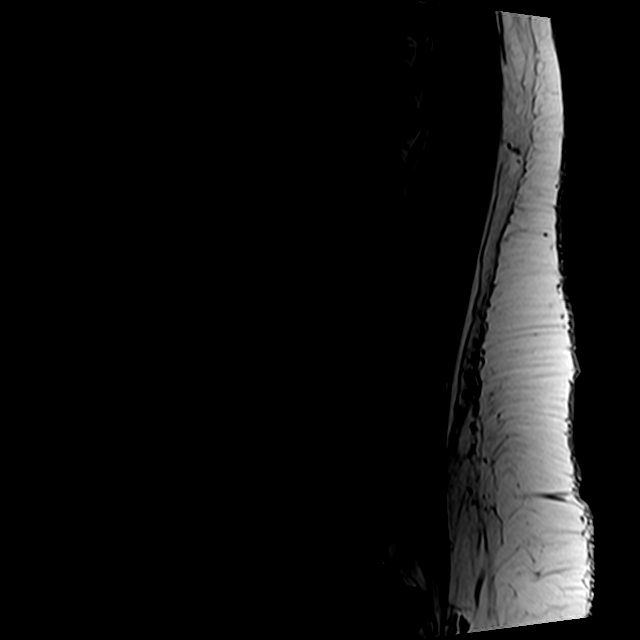
[im 6/15]
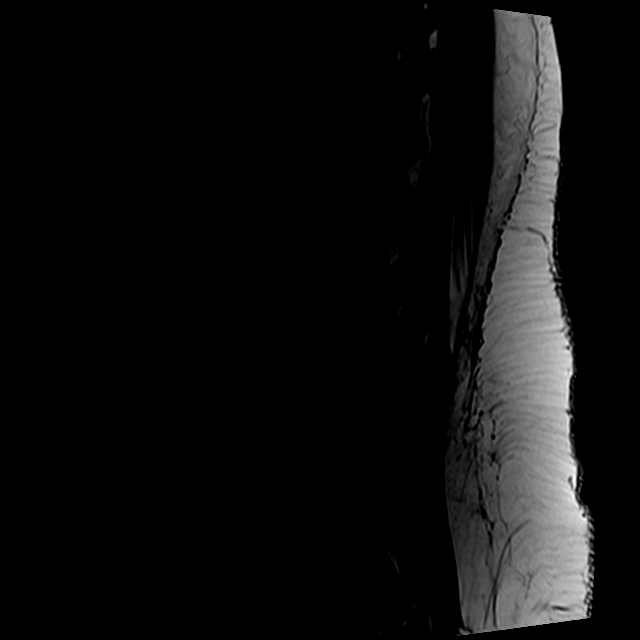
[im 9/15]
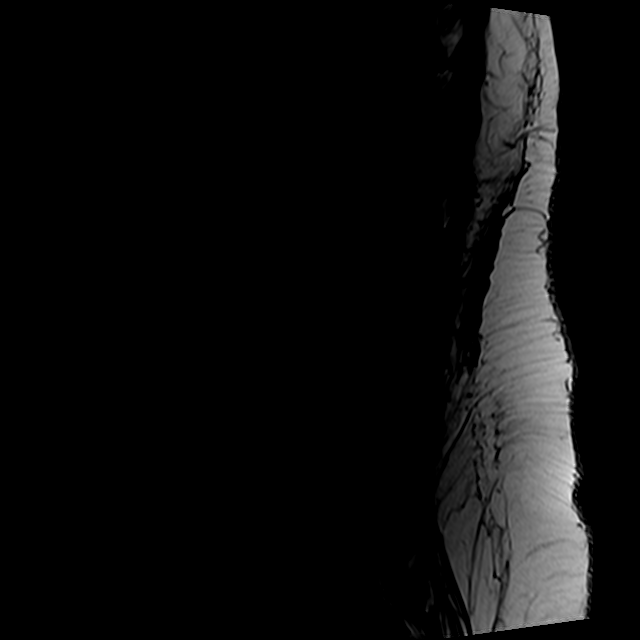
[im 12/15]
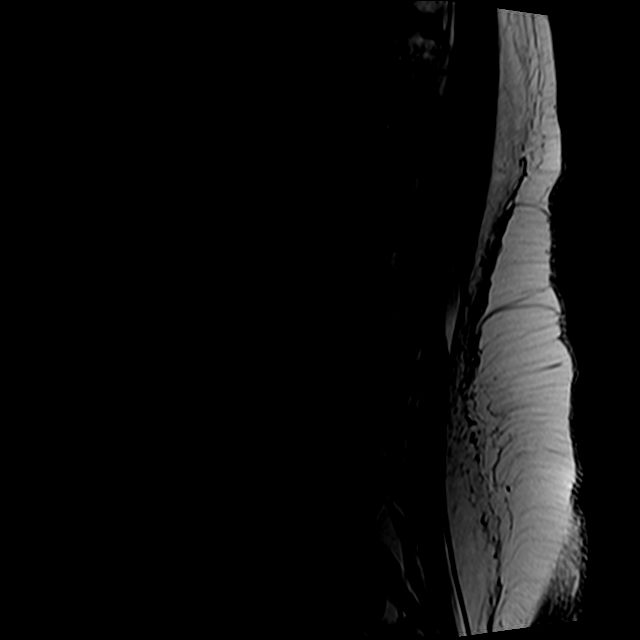
[im 15/15]
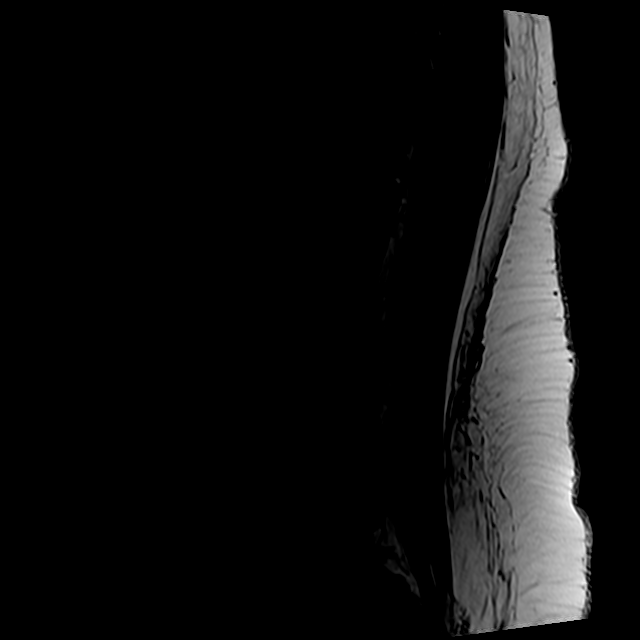

[Series 5: T2 · axial · 4.0mm · 0.78mm/px · z∈[-152,+62]mm · 9 of 39 slices shown (2 of 2)]
[im 1/39]
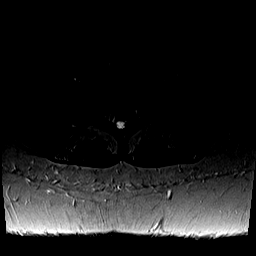
[im 6/39]
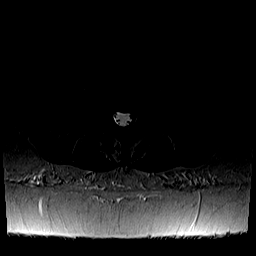
[im 11/39]
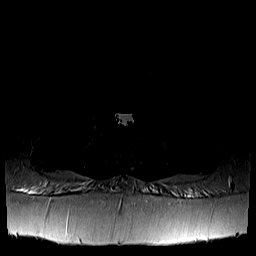
[im 17/39]
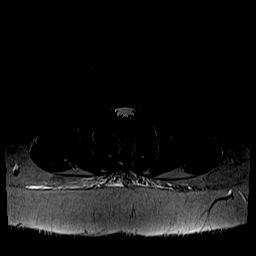
[im 20/39]
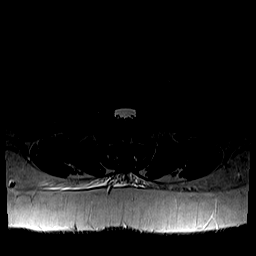
[im 22/39]
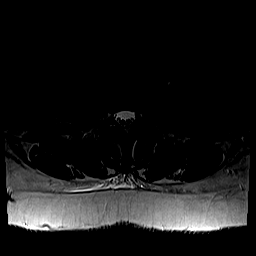
[im 28/39]
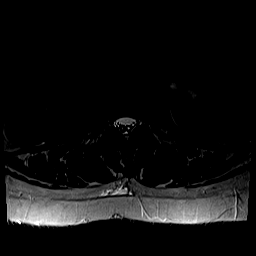
[im 33/39]
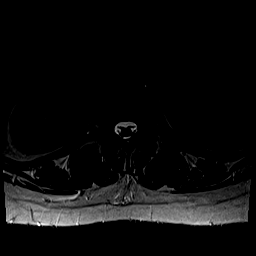
[im 39/39]
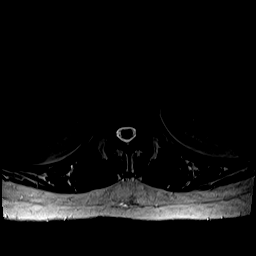

[Series 6: T1 · axial · 4.0mm · 0.39mm/px · z∈[-152,+32]mm · 5 of 39 slices shown (2 of 2)]
[im 1/39]
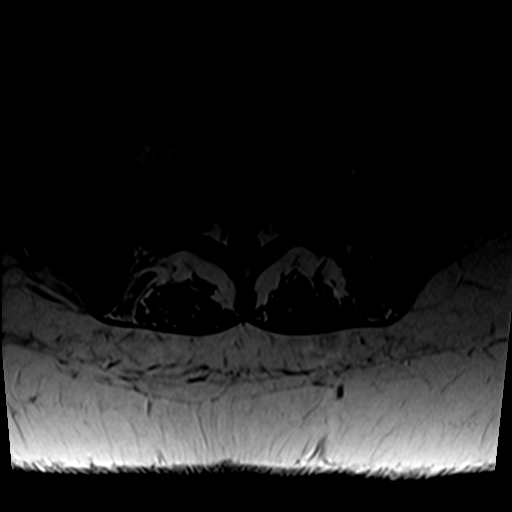
[im 6/39]
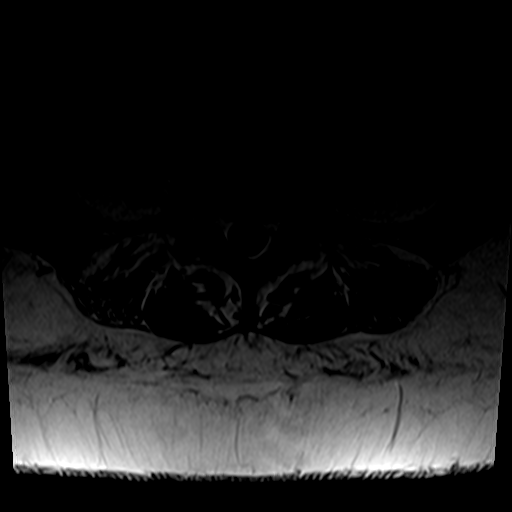
[im 11/39]
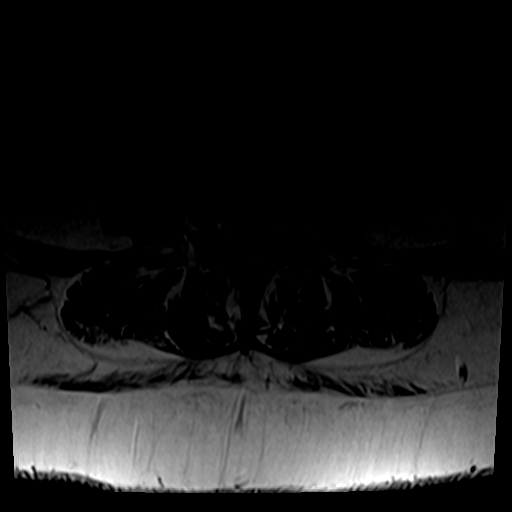
[im 20/39]
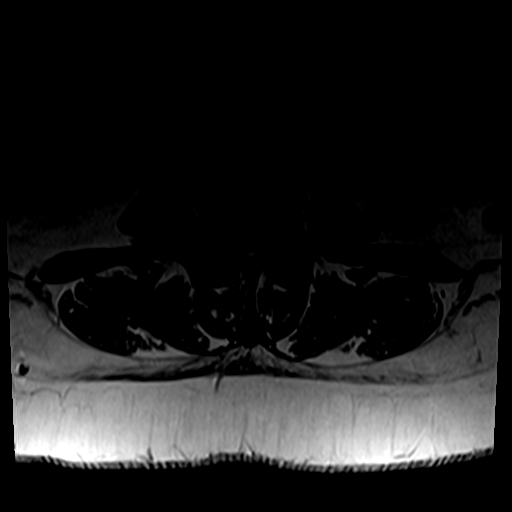
[im 33/39]
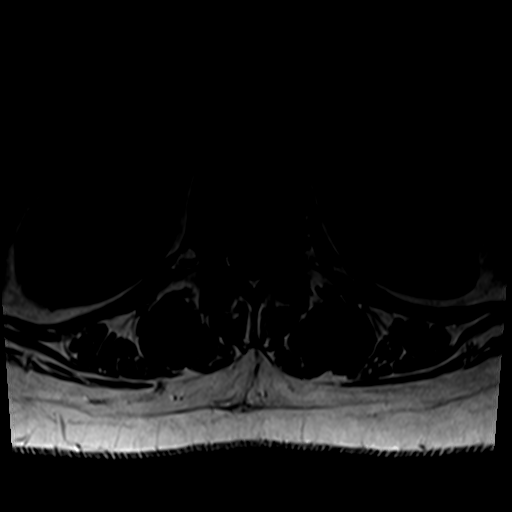

[26 of 48 positions shown; findings below may reference images not displayed]

FINDINGS: Segmentation:  Standard.

Alignment:  Physiologic.

Vertebrae:  No fracture, evidence of discitis, or bone lesion.

Conus medullaris and cauda equina: Conus extends to the L1-L2 level.
Conus and cauda equina appear normal.

Paraspinal and other soft tissues: Negative.

Disc levels:

T12-L1: No significant disc bulge. No spinal canal stenosis or
neural foraminal narrowing.

L1-L2: No significant disc bulge. No spinal canal stenosis or neural
foraminal narrowing.

L2-L3: No significant disc bulge. Mild right facet arthropathy. No
spinal canal stenosis or neural foraminal narrowing.

L3-L4: No significant disc bulge. No spinal canal stenosis or neural
foraminal narrowing.

L4-L5: No significant disc bulge. No spinal canal stenosis or neural
foraminal narrowing.

L5-S1: Disc desiccation and height loss with mild disc bulge,
somewhat left eccentric, with superimposed small left paracentral
disc protrusion. Mild facet arthropathy. No spinal canal stenosis.
Mild left neural foraminal narrowing.
IMPRESSION: Mild degenerative disc disease at L5-S1, with mild left neural
foraminal narrowing. No spinal canal stenosis.

## 2022-01-20 IMAGING — XA Imaging study
1 series · 1 of 1 positions shown · non-contrast
Comparison: none

CLINICAL DATA: Radiculopathy right. Displacement of the L5-S1
lumbar disc. Central annular tear.

[Series 1: ortho adipose · 1 of 1 slices shown]
[im 1/1]
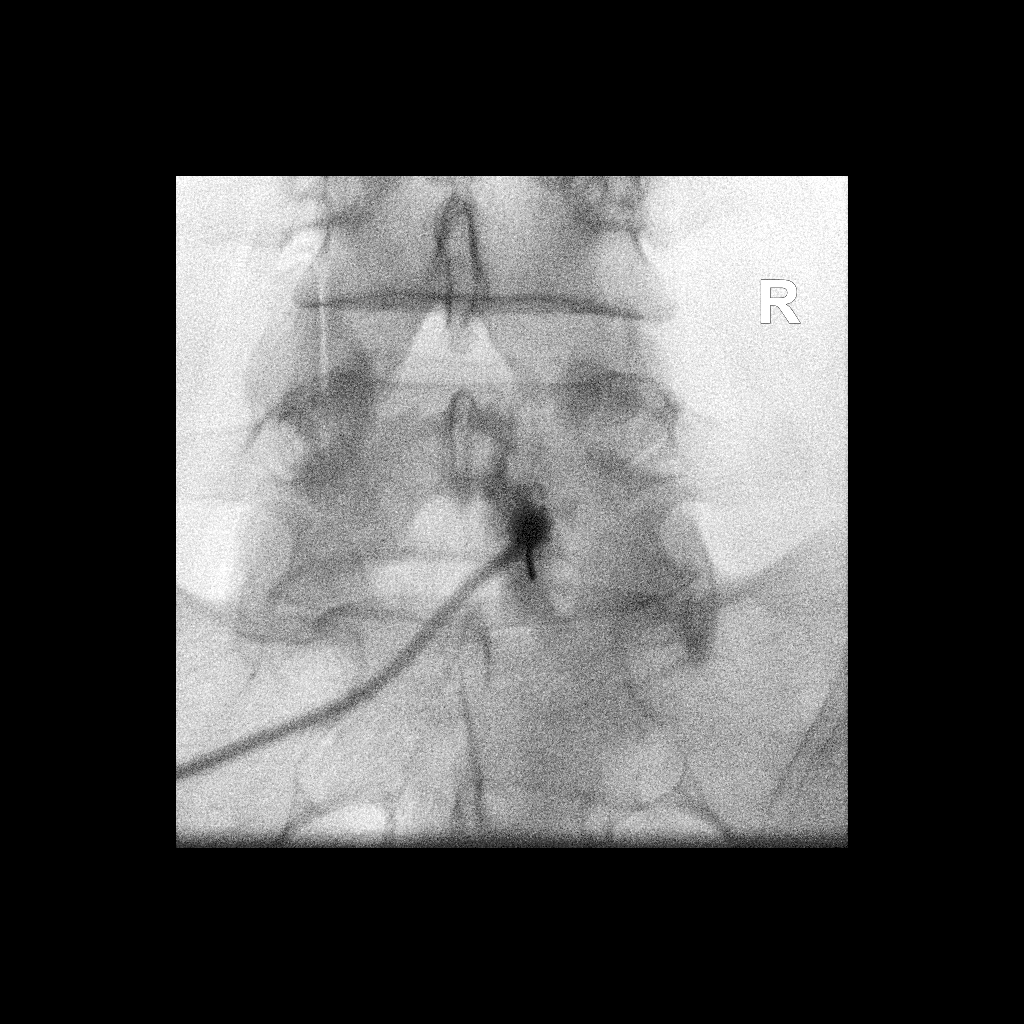

[1 of 1 positions shown; findings below may reference images not displayed]

FLUOROSCOPY TIME:  Radiation Exposure Index (as provided by the
fluoroscopic device): 21.32 uGy*m2

PROCEDURE:
The procedure, risks, benefits, and alternatives were explained to
the patient. Questions regarding the procedure were encouraged and
answered. The patient understands and consents to the procedure.

LUMBAR EPIDURAL INJECTION:

An interlaminar approach was performed on right at L5-S1. The
overlying skin was cleansed and anesthetized. A 20 gauge epidural
needle was advanced using loss-of-resistance technique.

DIAGNOSTIC EPIDURAL INJECTION:

Injection of Isovue-M 200 shows a good epidural pattern with spread
above and below the level of needle placement, primarily on the
right no vascular opacification is seen.

THERAPEUTIC EPIDURAL INJECTION:

Eighty mg of Depo-Medrol mixed with 2 mL 1% lidocaine were
instilled. The procedure was well-tolerated, and the patient was
discharged thirty minutes following the injection in good condition.
IMPRESSION: Technically successful epidural injection on the right L5-S1 # 1

## 2024-04-27 ENCOUNTER — Encounter: Payer: Self-pay | Admitting: Sports Medicine

## 2024-09-01 ENCOUNTER — Ambulatory Visit
Admission: EM | Admit: 2024-09-01 | Discharge: 2024-09-01 | Disposition: A | Discharge status: Home / Self Care | Attending: Family Medicine | Admitting: Family Medicine

## 2024-09-01 DIAGNOSIS — R6889 Other general symptoms and signs: Secondary | ICD-10-CM | POA: Insufficient documentation

## 2024-09-01 DIAGNOSIS — J029 Acute pharyngitis, unspecified: Secondary | ICD-10-CM | POA: Diagnosis present

## 2024-09-01 LAB — POCT RAPID STREP A (OFFICE): Rapid Strep A Screen: NEGATIVE

## 2024-09-01 LAB — POC COVID19/FLU A&B COMBO
Covid Antigen, POC: NEGATIVE
Influenza A Antigen, POC: NEGATIVE
Influenza B Antigen, POC: NEGATIVE

## 2024-09-01 MED ORDER — AZITHROMYCIN 250 MG PO TABS: 250.0000 mg | ORAL_TABLET | Freq: Every day | ORAL | 0 refills | Status: AC

## 2024-09-01 NOTE — ED Triage Notes (Signed)
 Pt c/o fever, sore throat and bodyaches x 2 days. No known exposure. Tylenol prn.

## 2024-09-01 NOTE — Discharge Instructions (Addendum)
 Advised patient take medication as directed with food to completion.  Advised patient may take OTC Tylenol 1000 mg every 6 hours for fever (oral temperature greater than 100.3).  Encouraged increase daily water intake to 64 ounces per day while taking this medication.  Advised if symptoms worsen and/or unresolved please follow-up with PCP or here for further evaluation.

## 2024-09-01 NOTE — ED Provider Notes (Signed)
 " [facility]    CSN: [id number] Arrival date & time: 09/01/24  1419      History   Chief Complaint Chief Complaint  Patient presents with   Fever   Generalized Body Aches   Sore Throat    HPI Rebekah Gaines is a 44 y.o. female.   HPI Pleasant 44 year old female presents with fever, generalized bodyaches, and sore throat for 2 days.  Patient reports a white coat of something over the surface of her tonsils. PMH significant for obesity, daily smoker, and right lumbar radiculopathy.  History reviewed. No pertinent past medical history.  Patient Active Problem List   Diagnosis Date Noted   Lumbar radiculopathy, right 04/25/2021    History reviewed. No pertinent surgical history.  OB History   No obstetric history on file.      Home Medications    Prior to Admission medications  Medication Sig Start Date End Date Taking? Authorizing Provider  azithromycin  (ZITHROMAX ) 250 MG tablet Take 1 tablet (250 mg total) by mouth daily. Take first 2 tablets together, then 1 every day until finished. 09/01/24  Yes Teddy Sharper, FNP  gabapentin  (NEURONTIN ) 300 MG capsule Start with one capsule po at night.  Increase to 2x then 3x a day as tolerated.  May cause drowsiness 05/02/21   Maranda Jamee Jacob, MD  ibuprofen  (ADVIL ) 200 MG tablet Take 4 tablets (800 mg total) by mouth every 8 (eight) hours as needed. 04/25/21   Curtis Debby PARAS, MD    Family History Family History  Problem Relation Age of Onset   Healthy Mother    Healthy Father     Social History Social History[1]   Allergies   Codeine and Hydrocodone   Review of Systems Review of Systems  Constitutional:  Positive for fever.  HENT:  Positive for congestion and sore throat.   Musculoskeletal:  Positive for arthralgias and myalgias.  All other systems reviewed and are negative.    Physical Exam Triage Vital Signs ED Triage Vitals  Encounter Vitals Group     BP 09/01/24 1430 (!) 147/84      Girls Systolic BP Percentile --      Girls Diastolic BP Percentile --      Boys Systolic BP Percentile --      Boys Diastolic BP Percentile --      Pulse Rate 09/01/24 1430 81     Resp 09/01/24 1430 17     Temp 09/01/24 1430 98.9 F (37.2 C)     Temp Source 09/01/24 1430 Oral     SpO2 09/01/24 1430 97 %     Weight --      Height --      Head Circumference --      Peak Flow --      Pain Score 09/01/24 1431 5     Pain Loc --      Pain Education --      Exclude from Growth Chart --    No data found.  Updated Vital Signs BP (!) 147/84 (BP Location: Right Arm)   Pulse 81   Temp 98.9 F (37.2 C) (Oral)   Resp 17   LMP  (LMP Unknown)   SpO2 97%   Visual Acuity Right Eye Distance:   Left Eye Distance:   Bilateral Distance:    Right Eye Near:   Left Eye Near:    Bilateral Near:     Physical Exam Vitals and nursing note reviewed.  Constitutional:  Appearance: Normal appearance. She is obese. She is ill-appearing.  HENT:     Head: Normocephalic and atraumatic.     Right Ear: Tympanic membrane, ear canal and external ear normal.     Left Ear: Tympanic membrane, ear canal and external ear normal.     Mouth/Throat:     Mouth: Mucous membranes are moist.     Pharynx: Oropharynx is clear. Uvula midline. Posterior oropharyngeal erythema present.     Tonsils: Tonsillar exudate present. 2+ on the right. 2+ on the left.  Eyes:     Extraocular Movements: Extraocular movements intact.     Conjunctiva/sclera: Conjunctivae normal.     Pupils: Pupils are equal, round, and reactive to light.  Cardiovascular:     Rate and Rhythm: Normal rate and regular rhythm.     Heart sounds: Normal heart sounds.  Pulmonary:     Effort: Pulmonary effort is normal.     Breath sounds: Normal breath sounds. No wheezing, rhonchi or rales.  Musculoskeletal:        General: Normal range of motion.  Skin:    General: Skin is warm and dry.  Neurological:     General: No focal deficit  present.     Mental Status: She is alert and oriented to person, place, and time. Mental status is at baseline.  Psychiatric:        Mood and Affect: Mood normal.        Behavior: Behavior normal.      UC Treatments / Results  Labs (all labs ordered are listed, but only abnormal results are displayed) Labs Reviewed  CULTURE, GROUP A STREP ([facility])  POC COVID19/FLU A&B COMBO  POCT RAPID STREP A (OFFICE)    EKG   Radiology No results found.  Procedures Procedures (including critical care time)  Medications Ordered in UC Medications - No data to display  Initial Impression / Assessment and Plan / UC Course  I have reviewed the triage vital signs and the nursing notes.  Pertinent labs & imaging results that were available during my care of the patient were reviewed by me and considered in my medical decision making (see chart for details).     MDM: 1.  Pharyngitis, unspecified etiology-Rx'd Zithromax : Take as directed, rapid strep negative, throat culture ordered; 2.  Flulike symptoms-COVID-19/influenza both negative today.  Patient advised. Advised patient take medication as directed with food to completion.  Advised patient may take OTC Tylenol 1000 mg every 6 hours for fever (oral temperature greater than 100.3).  Encouraged increase daily water intake to 64 ounces per day while taking this medication.  Advised if symptoms worsen and/or unresolved please follow-up with PCP or here for further evaluation.  Patient discharged home, hemodynamically stable. Final Clinical Impressions(s) / UC Diagnoses   Final diagnoses:  Flu-like symptoms  Pharyngitis, unspecified etiology     Discharge Instructions      Advised patient take medication as directed with food to completion.  Advised patient may take OTC Tylenol 1000 mg every 6 hours for fever (oral temperature greater than 100.3).  Encouraged increase daily water intake to 64 ounces per day while taking this medication.   Advised if symptoms worsen and/or unresolved please follow-up with PCP or here for further evaluation.     ED Prescriptions     Medication Sig Dispense Auth. Provider   azithromycin  (ZITHROMAX ) 250 MG tablet Take 1 tablet (250 mg total) by mouth daily. Take first 2 tablets together, then 1 every day until finished.  6 tablet Gabriela Giannelli, FNP      PDMP not reviewed this encounter.    [1]  Social History Tobacco Use   Smoking status: Every Day    Current packs/day: 1.00    Types: Cigarettes   Smokeless tobacco: Never  Vaping Use   Vaping status: Never Used  Substance Use Topics   Alcohol use: Yes   Drug use: Not Currently     Teddy Sharper, FNP 09/01/24 1522  "

## 2024-09-04 LAB — CULTURE, GROUP A STREP (THRC)
